# Patient Record
Sex: Male | Born: 2000 | Race: Black or African American | Hispanic: No | Marital: Single | Smoking: Never smoker
Health system: Southern US, Community
[De-identification: ages and names within clinical notes are randomized; demographics above are authoritative.]

---
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
## 2020-04-05 ENCOUNTER — Encounter (HOSPITAL_BASED_OUTPATIENT_CLINIC_OR_DEPARTMENT_OTHER): Payer: Self-pay

## 2020-04-05 ENCOUNTER — Emergency Department (HOSPITAL_BASED_OUTPATIENT_CLINIC_OR_DEPARTMENT_OTHER): Payer: Medicaid Other

## 2020-04-05 ENCOUNTER — Emergency Department (HOSPITAL_BASED_OUTPATIENT_CLINIC_OR_DEPARTMENT_OTHER)
Admission: EM | Admit: 2020-04-05 | Discharge: 2020-04-05 | Disposition: A | Discharge status: Home / Self Care | Payer: Medicaid Other | Attending: Emergency Medicine | Admitting: Emergency Medicine

## 2020-04-05 ENCOUNTER — Other Ambulatory Visit: Payer: Self-pay

## 2020-04-05 DIAGNOSIS — J069 Acute upper respiratory infection, unspecified: Secondary | ICD-10-CM | POA: Insufficient documentation

## 2020-04-05 DIAGNOSIS — R05 Cough: Secondary | ICD-10-CM

## 2020-04-05 DIAGNOSIS — R059 Cough, unspecified: Secondary | ICD-10-CM

## 2020-04-05 LAB — GROUP A STREP BY PCR: Group A Strep by PCR: NOT DETECTED

## 2020-04-05 NOTE — ED Notes (Signed)
Pt discharged to home. Discharge instructions have been discussed with patient and/or family members. Pt verbally acknowledges understanding d/c instructions, and endorses comprehension to checkout at registration before leaving.  °

## 2020-04-05 NOTE — ED Triage Notes (Signed)
Pt c/o flu like sx started yesterday-NAD-steady gait 

## 2020-04-05 NOTE — Discharge Instructions (Addendum)
Please continue monitoring your symptoms at home Your symptoms are likely related to a viral upper respiratory infection that does not require antibiotics Take Tylenol if you have a fever (temp > 100.4 F). I would recommend OTC medications as well for sore throat and cough including Ibuprofen and Dayquil/Nyquil Follow up with your PCP. If you do not have one there is a 1800 number on the last page of the discharge paperwork that will help you find one that accepts your insurance  Return to the ED IMMEDIATELY for any worsening symptoms including worsening cough, shortness of breath, persistent fevers > 100.4, inability to swallow liquids, drooling on yourself, voice change

## 2020-04-05 NOTE — ED Provider Notes (Signed)
[facility] EMERGENCY DEPARTMENT Provider Note   CSN: [id number] Arrival date & time: 04/05/20  1319     History Chief Complaint  Patient presents with  . Cough    Gary Perkins is a 19 y.o. male who presents to the ED today with complaint of gradual onset, constant, achy, sore throat x 2 days. Pt also complains of a dry cough and some chest tightness only when coughing, none at rest. He does report that he had similar symptoms in mid April and his mom brought home a rapid COVID test from work which returned positive. Pt denies any fevers, chills, difficulty swallowing, drooling, chest pain, shortness of breath, or any other associated symptoms.   The history is provided by the patient and medical records.       History reviewed. No pertinent past medical history.  There are no problems to display for this patient.   History reviewed. No pertinent surgical history.     No family history on file.  Social History   Tobacco Use  . Smoking status: Never Smoker  . Smokeless tobacco: Never Used  Substance Use Topics  . Alcohol use: Never  . Drug use: Never    Home Medications Prior to Admission medications   Not on File    Allergies    Patient has no known allergies.  Review of Systems   Review of Systems  Constitutional: Negative for chills and fever.  HENT: Positive for sore throat. Negative for trouble swallowing and voice change.   Respiratory: Positive for cough. Negative for shortness of breath.   Cardiovascular: Negative for chest pain.  All other systems reviewed and are negative.   Physical Exam Updated Vital Signs BP 127/67 (BP Location: Left Arm)   Pulse 71   Temp 99.3 F (37.4 C) (Oral)   Resp 16   Ht 5\' 9"  (1.753 m)   Wt 72.9 kg   SpO2 100%   BMI 23.75 kg/m   Physical Exam Vitals and nursing note reviewed.  Constitutional:      Appearance: He is not ill-appearing or diaphoretic.  HENT:     Head: Normocephalic and  atraumatic.     Right Ear: Tympanic membrane normal.     Left Ear: Tympanic membrane normal.     Mouth/Throat:     Mouth: Mucous membranes are moist.     Pharynx: Posterior oropharyngeal erythema present.  Eyes:     Conjunctiva/sclera: Conjunctivae normal.  Cardiovascular:     Rate and Rhythm: Normal rate and regular rhythm.     Pulses: Normal pulses.  Pulmonary:     Effort: Pulmonary effort is normal.     Breath sounds: Normal breath sounds. No wheezing, rhonchi or rales.  Abdominal:     Tenderness: There is no abdominal tenderness. There is no guarding or rebound.  Skin:    General: Skin is warm and dry.     Coloration: Skin is not jaundiced.  Neurological:     Mental Status: He is alert.     ED Results / Procedures / Treatments   Labs (all labs ordered are listed, but only abnormal results are displayed) Labs Reviewed  GROUP A STREP BY PCR    EKG None  Radiology DG Chest [facility] 1 View  Result Date: 04/05/2020 CLINICAL DATA:  Cough, flu like symptoms EXAM: PORTABLE CHEST 1 VIEW COMPARISON:  None. FINDINGS: The heart size and mediastinal contours are within normal limits. Both lungs are clear. The visualized skeletal structures are unremarkable. IMPRESSION:  Normal study. Electronically Signed   By: Rolm Baptise M.D.   On: 04/05/2020 15:29    Procedures Procedures (including critical care time)  Medications Ordered in ED Medications - No data to display  ED Course  I have reviewed the triage vital signs and the nursing notes.  Pertinent labs & imaging results that were available during my care of the patient were reviewed by me and considered in my medical decision making (see chart for details).    MDM Rules/Calculators/A&P                      19 year old male presents to the ED today complaining of a sore throat as well as a cough that began 1 to 2 days ago.  On arrival to the ED patient is afebrile, nontachycardic nontachypneic.  He appears to be in no acute  distress.  He is present with a sister who is having similar symptoms.  Does mention that he had a rapid Covid test which was + April 16 of this year.  States that his mom brought the test home from work and tested him.  Do not have this documented in our system.  He states he tested positive for Covid in April I do not think he needs to be tested again today.  Very well have a common cold today.   X-ray clear.  Rapid Covid test negative.  Do not feel patient needs any additional lab work or imaging at this time.  Sister has been tested for COVID-19 given she has not had a positive test.  He is advised that if she test positive he should self isolate at home as well.  He is in agreement with plan is stable for discharge.   This note was prepared using Dragon voice recognition software and may include unintentional dictation errors due to the inherent limitations of voice recognition software.   Final Clinical Impression(s) / ED Diagnoses Final diagnoses:  Cough  Viral URI with cough    Rx / DC Orders ED Discharge Orders    None       Discharge Instructions     Please continue monitoring your symptoms at home Your symptoms are likely related to a viral upper respiratory infection that does not require antibiotics Take Tylenol if you have a fever (temp > 100.4 F). I would recommend OTC medications as well for sore throat and cough including Ibuprofen and Dayquil/Nyquil Follow up with your PCP. If you do not have one there is a 1800 number on the last page of the discharge paperwork that will help you find one that accepts your insurance  Return to the ED IMMEDIATELY for any worsening symptoms including worsening cough, shortness of breath, persistent fevers > 100.4, inability to swallow liquids, drooling on yourself, voice change        Eustaquio Maize, PA-C 04/05/20 1651    Little, Wenda Overland, MD 04/10/20 [phone]

## 2021-07-01 IMAGING — DX DG CHEST 1V PORT
1 series · 1 of 1 positions shown · non-contrast
Comparison: None.

CLINICAL DATA: Cough, flu like symptoms

EXAM:
PORTABLE CHEST 1 VIEW

[chest ap]
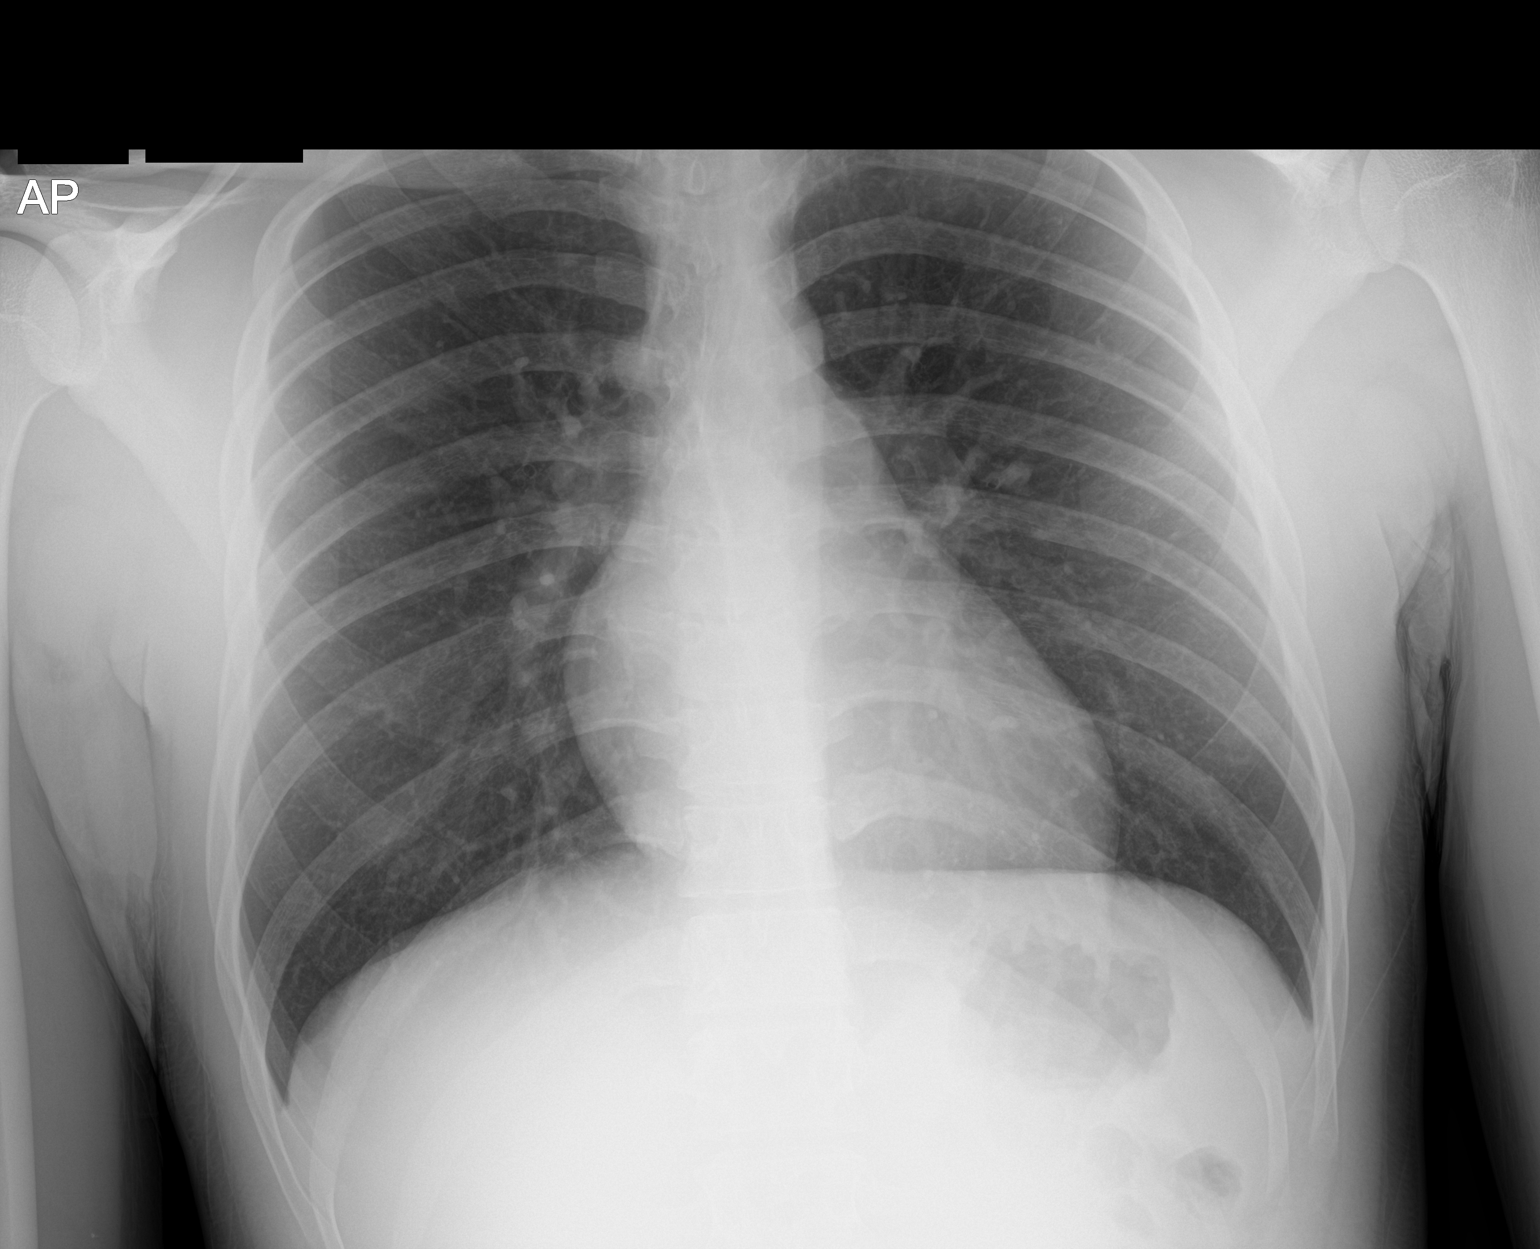

[1 of 1 positions shown; findings below may reference images not displayed]

FINDINGS: The heart size and mediastinal contours are within normal limits.
Both lungs are clear. The visualized skeletal structures are
unremarkable.
IMPRESSION: Normal study.

## 2022-07-01 ENCOUNTER — Emergency Department (HOSPITAL_BASED_OUTPATIENT_CLINIC_OR_DEPARTMENT_OTHER)
Admission: EM | Admit: 2022-07-01 | Discharge: 2022-07-01 | Disposition: A | Discharge status: Home / Self Care | Payer: Medicaid Other | Attending: Emergency Medicine | Admitting: Emergency Medicine

## 2022-07-01 ENCOUNTER — Encounter (HOSPITAL_BASED_OUTPATIENT_CLINIC_OR_DEPARTMENT_OTHER): Payer: Self-pay | Admitting: Emergency Medicine

## 2022-07-01 ENCOUNTER — Other Ambulatory Visit: Payer: Self-pay

## 2022-07-01 ENCOUNTER — Emergency Department (HOSPITAL_BASED_OUTPATIENT_CLINIC_OR_DEPARTMENT_OTHER): Payer: Medicaid Other

## 2022-07-01 DIAGNOSIS — R1013 Epigastric pain: Secondary | ICD-10-CM | POA: Diagnosis present

## 2022-07-01 DIAGNOSIS — K2900 Acute gastritis without bleeding: Secondary | ICD-10-CM | POA: Insufficient documentation

## 2022-07-01 DIAGNOSIS — D72829 Elevated white blood cell count, unspecified: Secondary | ICD-10-CM | POA: Insufficient documentation

## 2022-07-01 DIAGNOSIS — R61 Generalized hyperhidrosis: Secondary | ICD-10-CM | POA: Insufficient documentation

## 2022-07-01 LAB — CBC WITH DIFFERENTIAL/PLATELET
Abs Immature Granulocytes: 0.07 10*3/uL (ref 0.00–0.07)
Basophils Absolute: 0.1 10*3/uL (ref 0.0–0.1)
Basophils Relative: 0 %
Eosinophils Absolute: 0 10*3/uL (ref 0.0–0.5)
Eosinophils Relative: 0 %
HCT: 43.9 % (ref 39.0–52.0)
Hemoglobin: 15 g/dL (ref 13.0–17.0)
Immature Granulocytes: 0 %
Lymphocytes Relative: 5 %
Lymphs Abs: 0.8 10*3/uL (ref 0.7–4.0)
MCH: 28.6 pg (ref 26.0–34.0)
MCHC: 34.2 g/dL (ref 30.0–36.0)
MCV: 83.6 fL (ref 80.0–100.0)
Monocytes Absolute: 0.8 10*3/uL (ref 0.1–1.0)
Monocytes Relative: 5 %
Neutro Abs: 15.3 10*3/uL — ABNORMAL HIGH (ref 1.7–7.7)
Neutrophils Relative %: 90 %
Platelets: 275 10*3/uL (ref 150–400)
RBC: 5.25 MIL/uL (ref 4.22–5.81)
RDW: 13.3 % (ref 11.5–15.5)
WBC: 17 10*3/uL — ABNORMAL HIGH (ref 4.0–10.5)
nRBC: 0 % (ref 0.0–0.2)

## 2022-07-01 LAB — COMPREHENSIVE METABOLIC PANEL
ALT: 15 U/L (ref 0–44)
AST: 21 U/L (ref 15–41)
Albumin: 4.6 g/dL (ref 3.5–5.0)
Alkaline Phosphatase: 64 U/L (ref 38–126)
Anion gap: 10 (ref 5–15)
BUN: 9 mg/dL (ref 6–20)
CO2: 22 mmol/L (ref 22–32)
Calcium: 9.6 mg/dL (ref 8.9–10.3)
Chloride: 105 mmol/L (ref 98–111)
Creatinine, Ser: 0.85 mg/dL (ref 0.61–1.24)
GFR, Estimated: >60 mL/min (ref >60)
Glucose, Bld: 126 mg/dL — ABNORMAL HIGH (ref 70–99)
Potassium: 3.6 mmol/L (ref 3.5–5.1)
Sodium: 137 mmol/L (ref 135–145)
Total Bilirubin: 2.5 mg/dL — ABNORMAL HIGH (ref 0.3–1.2)
Total Protein: 8.2 g/dL — ABNORMAL HIGH (ref 6.5–8.1)

## 2022-07-01 LAB — LIPASE, BLOOD: Lipase: 28 U/L (ref 11–51)

## 2022-07-01 MED ORDER — DROPERIDOL 2.5 MG/ML IJ SOLN
2.5000 mg | Freq: Once | INTRAMUSCULAR | Status: AC
  Administered 2022-07-01: 2.5 mg via INTRAVENOUS
  Filled 2022-07-01: qty 2

## 2022-07-01 MED ORDER — ONDANSETRON 4 MG PO TBDP: 4.0000 mg | ORAL_TABLET | Freq: Three times a day (TID) | ORAL | 0 refills | Status: AC | PRN

## 2022-07-01 MED ORDER — AMOXICILLIN-POT CLAVULANATE 875-125 MG PO TABS: 1.0000 | ORAL_TABLET | Freq: Two times a day (BID) | ORAL | 0 refills | Status: AC

## 2022-07-01 MED ORDER — IOHEXOL 300 MG/ML  SOLN
100.0000 mL | Freq: Once | INTRAMUSCULAR | Status: AC | PRN
  Administered 2022-07-01: 100 mL via INTRAVENOUS

## 2022-07-01 MED ORDER — PANTOPRAZOLE SODIUM 40 MG PO TBEC: 40.0000 mg | DELAYED_RELEASE_TABLET | Freq: Every day | ORAL | 0 refills | Status: AC

## 2022-07-01 MED ORDER — SODIUM CHLORIDE 0.9 % IV BOLUS
1000.0000 mL | Freq: Once | INTRAVENOUS | Status: AC
  Administered 2022-07-01: 1000 mL via INTRAVENOUS

## 2022-07-01 MED ORDER — AMOXICILLIN-POT CLAVULANATE 875-125 MG PO TABS
1.0000 | ORAL_TABLET | Freq: Once | ORAL | Status: AC
  Administered 2022-07-01: 1 via ORAL
  Filled 2022-07-01: qty 1

## 2022-07-01 MED ORDER — PANTOPRAZOLE SODIUM 40 MG IV SOLR
40.0000 mg | Freq: Once | INTRAVENOUS | Status: AC
  Administered 2022-07-01: 40 mg via INTRAVENOUS
  Filled 2022-07-01: qty 10

## 2022-07-01 NOTE — Discharge Instructions (Addendum)
It is very important for you to avoid marijuana, this can make your vomiting and abdominal pain worse.    Your CT scan showed possible appendicitis.  Please complete the course of antibiotics.    Get rechecked immediately if you cannot keep fluids down, have worsening pain or new concerning symptoms.

## 2022-07-01 NOTE — ED Provider Notes (Signed)
[facility] EMERGENCY DEPARTMENT Provider Note   CSN: [id number] Arrival date & time: 07/01/22  0208     History  Chief Complaint  Patient presents with   Abdominal Pain    Gary Perkins is a 21 y.o. male.  The history is provided by the patient and medical records.  Abdominal Pain Hutton Pellicane is a 21 y.o. male who presents to the Emergency Department complaining of abdominal pain.  He presents emergency department accompanied by his mother for evaluation of sharp epigastric abdominal pain that started at 10 PM.  He has associated nausea and vomiting with 12 episodes of nonbloody emesis.  No associated diarrhea, fever.  He has no prior abdominal surgeries, no known medical problems.  He takes no medications.  He did have a similar episode 4 months ago and was seen in the emergency department in [place].  He does use tobacco, no alcohol use.  He uses marijuana frequently.      Home Medications Prior to Admission medications   Medication Sig Start Date End Date Taking? Authorizing Provider  amoxicillin-clavulanate (AUGMENTIN) 875-125 MG tablet Take 1 tablet by mouth every 12 (twelve) hours for 14 days. 07/01/22 07/15/22 Yes Tilden Fossa, MD  ondansetron (ZOFRAN-ODT) 4 MG disintegrating tablet Take 1 tablet (4 mg total) by mouth every 8 (eight) hours as needed for nausea or vomiting. 07/01/22  Yes Tilden Fossa, MD  pantoprazole (PROTONIX) 40 MG tablet Take 1 tablet (40 mg total) by mouth daily. 07/01/22  Yes Tilden Fossa, MD      Allergies    Patient has no known allergies.    Review of Systems   Review of Systems  Gastrointestinal:  Positive for abdominal pain.  All other systems reviewed and are negative.   Physical Exam Updated Vital Signs BP (!) 95/50   Pulse 80   Temp 98.3 F (36.8 C) (Oral)   Resp 20   Ht 5\' 9"  (1.753 m)   Wt 67.8 kg   SpO2 100%   BMI 22.07 kg/m  Physical Exam Vitals and nursing note reviewed.  Constitutional:       General: He is in acute distress.     Appearance: He is well-developed. He is diaphoretic.  HENT:     Head: Normocephalic and atraumatic.  Cardiovascular:     Rate and Rhythm: Normal rate and regular rhythm.  Pulmonary:     Effort: Pulmonary effort is normal.     Breath sounds: Normal breath sounds.  Abdominal:     Palpations: Abdomen is soft.     Tenderness: There is abdominal tenderness. There is no guarding or rebound.     Comments: Moderate generalized abdominal tenderness, greatest over the upper abdomen  Musculoskeletal:        General: No tenderness.  Skin:    General: Skin is warm.  Neurological:     Mental Status: He is alert and oriented to person, place, and time.  Psychiatric:        Behavior: Behavior normal.     ED Results / Procedures / Treatments   Labs (all labs ordered are listed, but only abnormal results are displayed) Labs Reviewed  COMPREHENSIVE METABOLIC PANEL - Abnormal; Notable for the following components:      Result Value   Glucose, Bld 126 (*)    Total Protein 8.2 (*)    Total Bilirubin 2.5 (*)    All other components within normal limits  CBC WITH DIFFERENTIAL/PLATELET - Abnormal; Notable for the following components:  WBC 17.0 (*)    Neutro Abs 15.3 (*)    All other components within normal limits  LIPASE, BLOOD    EKG EKG Interpretation  Date/Time:  Sunday July 01 2022 02:48:56 EDT Ventricular Rate:  78 PR Interval:  117 QRS Duration: 88 QT Interval:  376 QTC Calculation: 429 R Axis:   80 Text Interpretation: Sinus rhythm Borderline short PR interval Confirmed by Tilden Fossa [phone]) on 07/01/2022 2:53:54 AM  Radiology CT Abdomen Pelvis W Contrast  Addendum Date: 07/01/2022   ADDENDUM REPORT: 07/01/2022 04:24 ADDENDUM: Critical findings were reported to Dr. Tilden Fossa at 4:23 a.m. Electronically Signed   By: Thornell Sartorius M.D.   On: 07/01/2022 04:24   Result Date: 07/01/2022 CLINICAL DATA:  Nausea/vomiting, abdominal  pain. EXAM: CT ABDOMEN AND PELVIS WITH CONTRAST TECHNIQUE: Multidetector CT imaging of the abdomen and pelvis was performed using the standard protocol following bolus administration of intravenous contrast. RADIATION DOSE REDUCTION: This exam was performed according to the departmental dose-optimization program which includes automated exposure control, adjustment of the mA and/or kV according to patient size and/or use of iterative reconstruction technique. CONTRAST:  OMNIPAQUE IOHEXOL 300 MG/ML  SOLN COMPARISON:  None Available. FINDINGS: Lower chest: No acute abnormality. Hepatobiliary: No focal liver abnormality is seen. No gallstones, gallbladder wall thickening, or biliary dilatation. Pancreas: Unremarkable. No pancreatic ductal dilatation or surrounding inflammatory changes. Spleen: Normal in size without focal abnormality. Adrenals/Urinary Tract: No adrenal nodule or mass. The kidneys enhance symmetrically. Evaluation for renal calculus is limited due to excreted contrast at the renal pyramids. No hydronephrosis. The bladder is unremarkable. Stomach/Bowel: Gastric wall thickening is noted. The appendix is distended measuring 8 mm in diameter and mucosal enhancement is noted. There is questionable surrounding fat stranding. No bowel obstruction, free air, or pneumatosis. Vascular/Lymphatic: No significant vascular findings are present. Nonspecific prominent lymph nodes are noted in the inguinal regions bilaterally. Reproductive: Prostate is unremarkable. Other: No abdominal wall hernia or abnormality. No abdominopelvic ascites. Musculoskeletal: No acute osseous abnormality. IMPRESSION: 1. Distended appendix with mucosal enhancement and equivocal fat stranding, possible early appendicitis. Surgical consultation is recommended. 2. Mild gastric wall thickening, possible gastritis. Electronically Signed: By: Thornell Sartorius M.D. On: 07/01/2022 04:19    Procedures Procedures    Medications Ordered in  ED Medications  sodium chloride 0.9 % bolus 1,000 mL (0 mLs Intravenous Stopped 07/01/22 0343)  droperidol (INAPSINE) 2.5 MG/ML injection 2.5 mg (2.5 mg Intravenous Given 07/01/22 0256)  pantoprazole (PROTONIX) injection 40 mg (40 mg Intravenous Given 07/01/22 0340)  iohexol (OMNIPAQUE) 300 MG/ML solution 100 mL (100 mLs Intravenous Contrast Given 07/01/22 0358)  amoxicillin-clavulanate (AUGMENTIN) 875-125 MG per tablet 1 tablet (1 tablet Oral Given 07/01/22 0506)    ED Course/ Medical Decision Making/ A&P                           Medical Decision Making Amount and/or Complexity of Data Reviewed Labs: ordered. Radiology: ordered.  Risk Prescription drug management.   Patient here for evaluation of abdominal pain, vomiting.  Patient uncomfortable, diaphoretic appearing on initial assessment.  He was treated with antiemetic, IV fluids.  CBC with leukocytosis.  On repeat evaluation patient states his pain is improved and he appears comfortable on examination but on abdominal palpation he has mild to moderate generalized tenderness and a CT abdomen pelvis was obtained.  CT abdomen pelvis returned gastritis, possible early appendicitis.  On repeat evaluation patient states his pain is  resolved and abdominal examination is soft and nontender.  Discussed the patient with general surgeon on-call, Dr. Dossie Der.  He recommends given equivocal history and exam findings starting Augmentin for possible appendicitis.  Discussed this treatment plan with patient and mother and they are in agreement with plan.  Discussed close return precautions if there is progression of symptoms or new concerning symptoms.  Patient able to tolerate p.o. fluids without difficulty in the emergency department.        Final Clinical Impression(s) / ED Diagnoses Final diagnoses:  Acute gastritis without hemorrhage, unspecified gastritis type    Rx / DC Orders ED Discharge Orders          Ordered     amoxicillin-clavulanate (AUGMENTIN) 875-125 MG tablet  Every 12 hours        07/01/22 0455    pantoprazole (PROTONIX) 40 MG tablet  Daily        07/01/22 0455    ondansetron (ZOFRAN-ODT) 4 MG disintegrating tablet  Every 8 hours PRN        07/01/22 0455              Tilden Fossa, MD 07/01/22 [phone]

## 2022-07-01 NOTE — ED Triage Notes (Signed)
Pt reports central abd pain x~6 hrs, and "about 12" episodes of emesis "with sweating". Denies other sx. Marijuana use TID.

## 2022-07-01 NOTE — ED Notes (Signed)
Water given  

## 2022-10-30 ENCOUNTER — Emergency Department (HOSPITAL_BASED_OUTPATIENT_CLINIC_OR_DEPARTMENT_OTHER): Payer: Medicaid Other

## 2022-10-30 ENCOUNTER — Other Ambulatory Visit: Payer: Self-pay

## 2022-10-30 ENCOUNTER — Emergency Department (HOSPITAL_BASED_OUTPATIENT_CLINIC_OR_DEPARTMENT_OTHER)
Admission: EM | Admit: 2022-10-30 | Discharge: 2022-10-30 | Disposition: A | Discharge status: Home / Self Care | Payer: Medicaid Other | Attending: Emergency Medicine | Admitting: Emergency Medicine

## 2022-10-30 DIAGNOSIS — Y9367 Activity, basketball: Secondary | ICD-10-CM | POA: Diagnosis not present

## 2022-10-30 DIAGNOSIS — W1830XA Fall on same level, unspecified, initial encounter: Secondary | ICD-10-CM | POA: Diagnosis not present

## 2022-10-30 DIAGNOSIS — S63502A Unspecified sprain of left wrist, initial encounter: Secondary | ICD-10-CM | POA: Insufficient documentation

## 2022-10-30 DIAGNOSIS — M25532 Pain in left wrist: Secondary | ICD-10-CM | POA: Diagnosis present

## 2022-10-30 NOTE — ED Triage Notes (Signed)
Pt fell onto left wrist while playing basketball yesterday. Pt is able to move fingers but unable to move wrist. No meds PTA.

## 2022-10-30 NOTE — Discharge Instructions (Signed)
Please read and follow all provided instructions.  Your diagnoses today include:  1. Sprain of left wrist, initial encounter     Tests performed today include: An x-ray of your wrist - does NOT show any broken bones Vital signs. See below for your results today.   Medications prescribed:  Please use over-the-counter NSAID medications (ibuprofen, naproxen) or Tylenol (acetaminophen) as directed on the packaging for pain -- as long as you do not have any reasons avoid these medications. Reasons to avoid NSAID medications include: weak kidneys, a history of bleeding in your stomach or gut, or uncontrolled high blood pressure or previous heart attack. Reasons to avoid Tylenol include: liver problems or ongoing alcohol use. Never take more than 4000mg  or 8 Extra strength Tylenol in a 24 hour period.     Take any prescribed medications only as directed.  Home care instructions:  Follow any educational materials contained in this packet Wear your splint for at least one week or until seen by a physician for a follow-up examination. Follow R.I.C.E. Protocol: R - rest your injury  I  - use ice on injury without applying directly to skin C - compress injury with bandage or splint E - elevate the injury above the level of your heart as much as possible to reduce pain and swelling  Follow-up instructions: Please follow-up with your primary care provider or the provided orthopedic (bone specialist) if you continue to have significant pain or trouble using your wrist in 1 week. In this case you may have a severe injury that requires further care.   Generally, when wrists are moderately tender to touch following a fall or injury, a fracture (break in bone) may be present. Because of this, even if your x-rays were normal today, it is important that you receive follow-up care as suggested (you could still have a broken bone).  Return instructions:  Please return if your fingers are numb or tingling,  appear very red, white, gray or blue, or you have severe pain (also elevate wrist and loosen splint or wrap) Please return if you have difficulty moving your fingers. Please return to the Emergency Department if you experience worsening symptoms.  Please return if you have any other emergent concerns.  Additional Information:  Your vital signs today were: BP 114/82 (BP Location: Right Arm)   Pulse (!) 56   Temp 98.6 F (37 C)   Resp 18   Ht 5\' 9"  (1.753 m)   Wt 67.1 kg   SpO2 100%   BMI 21.86 kg/m  If your blood pressure (BP) was elevated above 135/85 this visit, please have this repeated by your doctor within one month. -------------- Wrist injuries are frequent in adults and children. A sprain is an injury to the ligaments that hold your bones together. A strain is an injury to muscle or muscle tendons (cord like structure) from stretching or pulling.   Remember the importance of follow-up and possible follow-up x-rays. Improvement in pain level is not 100% insurance of not having a fracture. --------------

## 2022-10-30 NOTE — ED Provider Notes (Signed)
[facility] EMERGENCY DEPARTMENT Provider Note   CSN: [id number] Arrival date & time: 10/30/22  1616     History  Chief Complaint  Patient presents with   Wrist Pain    Gary Perkins is a 21 y.o. male.  Patient presents to the emergency department for evaluation of left wrist pain starting yesterday after a fall while playing basketball.  Patient fell onto an outstretched left arm.  He is left-hand dominant.  He reports pain worse with movement and some swelling.  Pain is worse over the base of the thumb and over the radial side of the wrist.  No distal numbness or tingling.  No treatments prior to arrival.       Home Medications Prior to Admission medications   Medication Sig Start Date End Date Taking? Authorizing Provider  ondansetron (ZOFRAN-ODT) 4 MG disintegrating tablet Take 1 tablet (4 mg total) by mouth every 8 (eight) hours as needed for nausea or vomiting. 07/01/22   Tilden Fossa, MD  pantoprazole (PROTONIX) 40 MG tablet Take 1 tablet (40 mg total) by mouth daily. 07/01/22   Tilden Fossa, MD      Allergies    Patient has no known allergies.    Review of Systems   Review of Systems  Physical Exam Updated Vital Signs BP 114/82 (BP Location: Right Arm)   Pulse (!) 56   Temp 98.6 F (37 C)   Resp 18   Ht 5\' 9"  (1.753 m)   Wt 67.1 kg   SpO2 100%   BMI 21.86 kg/m  Physical Exam Vitals and nursing note reviewed.  Constitutional:      Appearance: He is well-developed.  HENT:     Head: Normocephalic and atraumatic.  Eyes:     Conjunctiva/sclera: Conjunctivae normal.  Cardiovascular:     Pulses: Normal pulses. No decreased pulses.  Musculoskeletal:        General: Tenderness present.     Left shoulder: Normal range of motion.     Left elbow: Normal range of motion.     Left forearm: No tenderness or bony tenderness.     Left wrist: Tenderness and snuff box tenderness present. Decreased range of motion.     Left hand: No bony tenderness.  Decreased range of motion (Secondary to wrist pain).     Cervical back: Normal range of motion and neck supple.     Right lower leg: No edema.     Left lower leg: No edema.  Skin:    General: Skin is warm and dry.  Neurological:     Mental Status: He is alert.     Sensory: No sensory deficit.     Comments: Motor, sensation, and vascular distal to the injury is fully intact.   Psychiatric:        Mood and Affect: Mood normal.     ED Results / Procedures / Treatments   Labs (all labs ordered are listed, but only abnormal results are displayed) Labs Reviewed - No data to display  EKG None  Radiology DG Wrist Complete Left  Result Date: 10/30/2022 CLINICAL DATA:  Fall onto wrist while playing basketball EXAM: LEFT WRIST - COMPLETE 4 VIEW COMPARISON:  None Available. FINDINGS: There is no evidence of fracture or dislocation. There is no evidence of arthropathy or other focal bone abnormality. Soft tissue swelling about the wrist. IMPRESSION: Soft tissue swelling about the wrist without evidence of fracture or dislocation. Electronically Signed   By: 14/03/2022.D.  On: 10/30/2022 17:27    Procedures Procedures    Medications Ordered in ED Medications - No data to display  ED Course/ Medical Decision Making/ A&P    Patient seen and examined. History obtained directly from patient. Work-up including labs, imaging, EKG ordered in triage, if performed, were reviewed.    Labs/EKG: None ordered  Imaging: Independently reviewed and interpreted.  This included: X-ray of the wrist, agree no fractures.  Medications/Fluids: None ordered  Most recent vital signs reviewed and are as follows: BP 114/82 (BP Location: Right Arm)   Pulse (!) 56   Temp 98.6 F (37 C)   Resp 18   Ht 5\' 9"  (1.753 m)   Wt 67.1 kg   SpO2 100%   BMI 21.86 kg/m   Initial impression: Wrist pain, injury  Home treatment plan: RICE protocol, NSAIDs, wrist brace  Return instructions discussed with  patient: Worsening pain, swelling, new symptoms or other concerns  Follow-up instructions discussed with patient: Sports medicine referral given if not improving in next several days.                           Medical Decision Making Amount and/or Complexity of Data Reviewed Radiology: ordered.   Left wrist pain.  Patient is able to flex and extend but decreased due to pain.  He has fairly tender over the base of the thumb, radial wrist and in the anatomic snuffbox.  For this reason, patient given Velcro wrist splint and sports medicine follow-up.  No signs of compartment syndrome or neurovascular compromise in the left upper extremity.  Denies head or neck injury.  The patient's vital signs, pertinent lab work and imaging were reviewed and interpreted as discussed in the ED course. Hospitalization was considered for further testing, treatments, or serial exams/observation. However as patient is well-appearing, has a stable exam, and reassuring studies today, I do not feel that they warrant admission at this time. This plan was discussed with the patient who verbalizes agreement and comfort with this plan and seems reliable and able to return to the Emergency Department with worsening or changing symptoms.          Final Clinical Impression(s) / ED Diagnoses Final diagnoses:  Sprain of left wrist, initial encounter    Rx / DC Orders ED Discharge Orders     None         , PA-C 10/30/22 1950    14/05/23, MD 11/03/22 1023

## 2024-11-08 ENCOUNTER — Other Ambulatory Visit: Payer: Self-pay

## 2024-11-08 ENCOUNTER — Encounter (HOSPITAL_BASED_OUTPATIENT_CLINIC_OR_DEPARTMENT_OTHER): Payer: Self-pay

## 2024-11-08 ENCOUNTER — Emergency Department (HOSPITAL_BASED_OUTPATIENT_CLINIC_OR_DEPARTMENT_OTHER)
Admission: EM | Admit: 2024-11-08 | Discharge: 2024-11-08 | Disposition: A | Discharge status: Home / Self Care | Attending: Emergency Medicine | Admitting: Emergency Medicine

## 2024-11-08 ENCOUNTER — Emergency Department (HOSPITAL_BASED_OUTPATIENT_CLINIC_OR_DEPARTMENT_OTHER)

## 2024-11-08 DIAGNOSIS — R112 Nausea with vomiting, unspecified: Secondary | ICD-10-CM | POA: Diagnosis not present

## 2024-11-08 DIAGNOSIS — R109 Unspecified abdominal pain: Secondary | ICD-10-CM | POA: Diagnosis present

## 2024-11-08 DIAGNOSIS — R1013 Epigastric pain: Secondary | ICD-10-CM | POA: Diagnosis not present

## 2024-11-08 LAB — CBC WITH DIFFERENTIAL/PLATELET
Abs Immature Granulocytes: 0.02 K/uL (ref 0.00–0.07)
Basophils Absolute: 0.1 K/uL (ref 0.0–0.1)
Basophils Relative: 1 %
Eosinophils Absolute: 0 K/uL (ref 0.0–0.5)
Eosinophils Relative: 0 %
HCT: 45.9 % (ref 39.0–52.0)
Hemoglobin: 15.7 g/dL (ref 13.0–17.0)
Immature Granulocytes: 0 %
Lymphocytes Relative: 14 %
Lymphs Abs: 1.2 K/uL (ref 0.7–4.0)
MCH: 28.9 pg (ref 26.0–34.0)
MCHC: 34.2 g/dL (ref 30.0–36.0)
MCV: 84.4 fL (ref 80.0–100.0)
Monocytes Absolute: 0.8 K/uL (ref 0.1–1.0)
Monocytes Relative: 9 %
Neutro Abs: 6.5 K/uL (ref 1.7–7.7)
Neutrophils Relative %: 76 %
Platelets: 271 K/uL (ref 150–400)
RBC: 5.44 MIL/uL (ref 4.22–5.81)
RDW: 13.2 % (ref 11.5–15.5)
WBC: 8.6 K/uL (ref 4.0–10.5)
nRBC: 0 % (ref 0.0–0.2)

## 2024-11-08 LAB — URINALYSIS, ROUTINE W REFLEX MICROSCOPIC
Glucose, UA: NEGATIVE mg/dL
Ketones, ur: >=80 mg/dL — AB
Nitrite: NEGATIVE
Protein, ur: 100 mg/dL — AB
Specific Gravity, Urine: >=1.03 (ref 1.005–1.030)
pH: 5.5 (ref 5.0–8.0)

## 2024-11-08 LAB — COMPREHENSIVE METABOLIC PANEL WITH GFR
ALT: 16 U/L (ref 0–44)
AST: 25 U/L (ref 15–41)
Albumin: 4.9 g/dL (ref 3.5–5.0)
Alkaline Phosphatase: 74 U/L (ref 38–126)
Anion gap: 16 — ABNORMAL HIGH (ref 5–15)
BUN: 17 mg/dL (ref 6–20)
CO2: 23 mmol/L (ref 22–32)
Calcium: 9.8 mg/dL (ref 8.9–10.3)
Chloride: 98 mmol/L (ref 98–111)
Creatinine, Ser: 0.97 mg/dL (ref 0.61–1.24)
GFR, Estimated: >60 mL/min (ref >60)
Glucose, Bld: 92 mg/dL (ref 70–99)
Potassium: 3.6 mmol/L (ref 3.5–5.1)
Sodium: 137 mmol/L (ref 135–145)
Total Bilirubin: 2.2 mg/dL — ABNORMAL HIGH (ref 0.0–1.2)
Total Protein: 8 g/dL (ref 6.5–8.1)

## 2024-11-08 LAB — URINALYSIS, MICROSCOPIC (REFLEX)

## 2024-11-08 LAB — LIPASE, BLOOD: Lipase: 44 U/L (ref 11–51)

## 2024-11-08 MED ORDER — SODIUM CHLORIDE 0.9 % IV BOLUS
500.0000 mL | Freq: Once | INTRAVENOUS | Status: AC
  Administered 2024-11-08: 500 mL via INTRAVENOUS

## 2024-11-08 MED ORDER — ONDANSETRON HCL 4 MG/2ML IJ SOLN
4.0000 mg | Freq: Once | INTRAMUSCULAR | Status: AC
  Administered 2024-11-08: 4 mg via INTRAVENOUS
  Filled 2024-11-08: qty 2

## 2024-11-08 MED ORDER — ONDANSETRON 4 MG PO TBDP: 4.0000 mg | ORAL_TABLET | Freq: Three times a day (TID) | ORAL | 0 refills | Status: AC | PRN

## 2024-11-08 MED ORDER — IOHEXOL 300 MG/ML  SOLN
75.0000 mL | Freq: Once | INTRAMUSCULAR | Status: AC | PRN
  Administered 2024-11-08: 75 mL via INTRAVENOUS

## 2024-11-08 MED ORDER — DICYCLOMINE HCL 20 MG PO TABS: 20.0000 mg | ORAL_TABLET | Freq: Two times a day (BID) | ORAL | 0 refills | Status: AC

## 2024-11-08 NOTE — Discharge Instructions (Signed)
 Today you were seen for abdominal pain with nausea and vomiting.  Your workup on the emergency department is reassuring.  You have been prescribed Bentyl  for abdominal pain and Zofran  for nausea and vomiting.  Please return to the ED if you have uncontrollable vomiting, severe abdominal pain, or fever that does not go down with Tylenol  or Motrin.  Thank you for letting us  treat you today. After reviewing your labs and imaging, I feel you are safe to go home. Please follow up with your PCP in the next several days and provide them with your records from this visit. Return to the Emergency Room if pain becomes severe or symptoms worsen.

## 2024-11-08 NOTE — ED Triage Notes (Signed)
 Pt states he has food poisoning that started Friday.  Pt endorses n/v and abdominal pain. Not tolerating PO food/liquids.

## 2024-11-08 NOTE — ED Notes (Signed)
Lab called for add on urine culture

## 2024-11-08 NOTE — ED Provider Notes (Signed)
 [place] EMERGENCY DEPARTMENT AT [facility] Provider Note   CSN: [id number] Arrival date & time: 11/08/24  1630     Patient presents with: Emesis   Gary Perkins is a 23 y.o. male.  Presents today for abdominal pain with nausea and vomiting since Friday.  Patient states that he got food poisoning on Friday.  Patient denies fever, chill, diarrhea, constipation, chest pain, shortness of breath, cough, congestion, any other complaints at this time.    Emesis Associated symptoms: abdominal pain        Prior to Admission medications  Medication Sig Start Date End Date Taking? Authorizing Provider  dicyclomine  (BENTYL ) 20 MG tablet Take 1 tablet (20 mg total) by mouth 2 (two) times daily. 11/08/24  Yes Francis Ileana SAILOR, PA-C  ondansetron  (ZOFRAN -ODT) 4 MG disintegrating tablet Take 1 tablet (4 mg total) by mouth every 8 (eight) hours as needed. 11/08/24  Yes Francis Ileana SAILOR, PA-C  ondansetron  (ZOFRAN -ODT) 4 MG disintegrating tablet Take 1 tablet (4 mg total) by mouth every 8 (eight) hours as needed for nausea or vomiting. 07/01/22   Griselda Norris, MD  pantoprazole  (PROTONIX ) 40 MG tablet Take 1 tablet (40 mg total) by mouth daily. 07/01/22   Griselda Norris, MD    Allergies: Patient has no known allergies.    Review of Systems  Gastrointestinal:  Positive for abdominal pain, nausea and vomiting.    Updated Vital Signs BP 128/74   Pulse (!) 59   Temp 98 F (36.7 C) (Oral)   Resp 17   SpO2 100%   Physical Exam Vitals and nursing note reviewed.  Constitutional:      General: He is not in acute distress.    Appearance: He is well-developed.  HENT:     Head: Normocephalic and atraumatic.  Eyes:     Conjunctiva/sclera: Conjunctivae normal.  Cardiovascular:     Rate and Rhythm: Normal rate and regular rhythm.     Heart sounds: No murmur heard. Pulmonary:     Effort: Pulmonary effort is normal. No respiratory distress.     Breath sounds: Normal breath sounds.   Abdominal:     General: There is no distension.     Palpations: Abdomen is soft.     Tenderness: There is abdominal tenderness in the epigastric area.  Musculoskeletal:        General: No swelling.     Cervical back: Neck supple.  Skin:    General: Skin is warm and dry.     Capillary Refill: Capillary refill takes less than 2 seconds.  Neurological:     Mental Status: He is alert.  Psychiatric:        Mood and Affect: Mood normal.     (all labs ordered are listed, but only abnormal results are displayed) Labs Reviewed  COMPREHENSIVE METABOLIC PANEL WITH GFR - Abnormal; Notable for the following components:      Result Value   Total Bilirubin 2.2 (*)    Anion gap 16 (*)    All other components within normal limits  URINALYSIS, ROUTINE W REFLEX MICROSCOPIC - Abnormal; Notable for the following components:   APPearance CLOUDY (*)    Hgb urine dipstick TRACE (*)    Bilirubin Urine SMALL (*)    Ketones, ur >=80 (*)    Protein, ur 100 (*)    Leukocytes,Ua TRACE (*)    All other components within normal limits  URINALYSIS, MICROSCOPIC (REFLEX) - Abnormal; Notable for the following components:   Bacteria, UA  FEW (*)    All other components within normal limits  URINE CULTURE  CBC WITH DIFFERENTIAL/PLATELET  LIPASE, BLOOD    EKG: None  Radiology: CT ABDOMEN PELVIS W CONTRAST Result Date: 11/08/2024 EXAM: CT ABDOMEN AND PELVIS WITH CONTRAST 11/08/2024 07:19:24 PM TECHNIQUE: CT of the abdomen and pelvis was performed with the administration of 75 mL of iohexol  (OMNIPAQUE ) 300 MG/ML solution. Multiplanar reformatted images are provided for review. Automated exposure control, iterative reconstruction, and/or weight-based adjustment of the mA/kV was utilized to reduce the radiation dose to as low as reasonably achievable. COMPARISON: None available. CLINICAL HISTORY: Abdominal pain, acute, nonlocalized. FINDINGS: LOWER CHEST: No acute abnormality. LIVER: The liver is unremarkable.  GALLBLADDER AND BILE DUCTS: Gallbladder is unremarkable. No biliary ductal dilatation. SPLEEN: No acute abnormality. PANCREAS: No acute abnormality. ADRENAL GLANDS: No acute abnormality. KIDNEYS, URETERS AND BLADDER: No stones in the kidneys or ureters. No hydronephrosis. No perinephric or periureteral stranding. Urinary bladder is unremarkable. GI AND BOWEL: Stomach demonstrates no acute abnormality. There is no bowel obstruction. Normal appendix (image 68). PERITONEUM AND RETROPERITONEUM: No ascites. No free air. VASCULATURE: Aorta is normal in caliber. LYMPH NODES: No lymphadenopathy. REPRODUCTIVE ORGANS: The prostate is unremarkable. BONES AND SOFT TISSUES: No acute osseous abnormality. No focal soft tissue abnormality. IMPRESSION: 1. No acute findings in the abdomen or pelvis. Electronically signed by: Pinkie Pebbles MD 11/08/2024 07:31 PM EST RP Workstation: [id number]     Procedures   Medications Ordered in the ED  ondansetron  (ZOFRAN ) injection 4 mg (4 mg Intravenous Given 11/08/24 1722)  ondansetron  (ZOFRAN ) injection 4 mg (4 mg Intravenous Given 11/08/24 1903)  sodium chloride  0.9 % bolus 500 mL (0 mLs Intravenous Stopped 11/08/24 2005)  iohexol  (OMNIPAQUE ) 300 MG/ML solution 75 mL (75 mLs Intravenous Contrast Given 11/08/24 1910)                                    Medical Decision Making Amount and/or Complexity of Data Reviewed Labs: ordered. Radiology: ordered.  Risk Prescription drug management.   This patient presents to the ED for concern of N/V, abd pain differential diagnosis includes choledocholithiasis, acute cholecystitis, pancreatitis, appendicitis, viral GI illness, diverticulitis, SBO, volvulus    Additional history obtained   Additional history obtained from Electronic Medical Record External records from outside source obtained and reviewed including Care Everywhere   Lab Tests:  I Ordered, and personally interpreted labs.  The pertinent results  include: CBC unremarkable, mild anion gap at 16, mildly elevated total bili at 2.2, UA with small bilirubin, trace hemoglobin, greater than 50 ketones, trace leukocytes, 100 protein, elevated specific gravity, bacteria, 6-10 squamous epithelials, 6-10 WBCs EKG: Ectopic atrial rhythm   Imaging Studies ordered:  I ordered imaging studies including CT abdomen pelvis with contrast I independently visualized and interpreted imaging which showed no acute findings in the abdomen or pelvis I agree with the radiologist interpretation   Medicines ordered and prescription drug management:  I ordered medication including zofran , bentyl  and IVF    I have reviewed the patients home medicines and have made adjustments as needed   Problem List / ED Course:  Patient tolerating p.o. intake without issue prior to discharge Consider for admission or further workup however patient's vital signs, physical exam, labs, and imaging are reassuring.  Patient given symptomatic management outpatient with Bentyl  and Zofran  ODT.  Patient given return precautions.  I feel patient is safe for discharge  at this time.       Final diagnoses:  Abdominal pain, unspecified abdominal location  Nausea and vomiting, unspecified vomiting type    ED Discharge Orders          Ordered    ondansetron  (ZOFRAN -ODT) 4 MG disintegrating tablet  Every 8 hours PRN        11/08/24 2038    dicyclomine  (BENTYL ) 20 MG tablet  2 times daily        11/08/24 2038               Francis Ileana SAILOR, PA-C 11/08/24 2103    Elnor Hila P, DO 11/10/24 1416

## 2024-11-08 NOTE — ED Notes (Signed)
 Pt unable to provide urine sample at this time. Urinal at bedside, will continue to encourage.

## 2024-11-10 LAB — URINE CULTURE
